# Patient Record
Sex: Male | Born: 1990 | Hispanic: Yes | Marital: Single | Smoking: Never smoker
Health system: Southern US, Community
[De-identification: ages and names within clinical notes are randomized; demographics above are authoritative.]

## PROBLEM LIST (undated history)

## (undated) HISTORY — PX: TONSILLECTOMY: SUR1361

---

## 2015-01-02 ENCOUNTER — Encounter (HOSPITAL_COMMUNITY): Payer: Self-pay | Admitting: *Deleted

## 2015-01-02 ENCOUNTER — Emergency Department (HOSPITAL_COMMUNITY): Payer: Worker's Compensation

## 2015-01-02 ENCOUNTER — Emergency Department (HOSPITAL_COMMUNITY)
Admission: EM | Admit: 2015-01-02 | Discharge: 2015-01-03 | Disposition: A | Discharge status: Home / Self Care | Payer: Worker's Compensation | Attending: Emergency Medicine | Admitting: Emergency Medicine

## 2015-01-02 DIAGNOSIS — W231XXA Caught, crushed, jammed, or pinched between stationary objects, initial encounter: Secondary | ICD-10-CM | POA: Diagnosis not present

## 2015-01-02 DIAGNOSIS — S62639B Displaced fracture of distal phalanx of unspecified finger, initial encounter for open fracture: Secondary | ICD-10-CM

## 2015-01-02 DIAGNOSIS — S61313A Laceration without foreign body of left middle finger with damage to nail, initial encounter: Secondary | ICD-10-CM | POA: Insufficient documentation

## 2015-01-02 DIAGNOSIS — Y929 Unspecified place or not applicable: Secondary | ICD-10-CM | POA: Insufficient documentation

## 2015-01-02 DIAGNOSIS — S61213A Laceration without foreign body of left middle finger without damage to nail, initial encounter: Secondary | ICD-10-CM

## 2015-01-02 DIAGNOSIS — Y998 Other external cause status: Secondary | ICD-10-CM | POA: Insufficient documentation

## 2015-01-02 DIAGNOSIS — S62633B Displaced fracture of distal phalanx of left middle finger, initial encounter for open fracture: Secondary | ICD-10-CM | POA: Insufficient documentation

## 2015-01-02 DIAGNOSIS — Y9389 Activity, other specified: Secondary | ICD-10-CM | POA: Diagnosis not present

## 2015-01-02 DIAGNOSIS — S6992XA Unspecified injury of left wrist, hand and finger(s), initial encounter: Secondary | ICD-10-CM | POA: Diagnosis present

## 2015-01-02 MED ORDER — LIDOCAINE HCL 2 % IJ SOLN
15.0000 mL | Freq: Once | INTRAMUSCULAR | Status: AC
  Administered 2015-01-02: 300 mg
  Filled 2015-01-02: qty 20

## 2015-01-02 MED ORDER — CEPHALEXIN 500 MG PO CAPS: 500.0000 mg | ORAL_CAPSULE | Freq: Four times a day (QID) | ORAL | Status: AC

## 2015-01-02 MED ORDER — CEFAZOLIN SODIUM 1 G IJ SOLR
2.0000 g | Freq: Once | INTRAMUSCULAR | Status: AC
  Administered 2015-01-03: 2 g via INTRAMUSCULAR
  Filled 2015-01-02: qty 20

## 2015-01-02 MED ORDER — OXYCODONE-ACETAMINOPHEN 5-325 MG PO TABS: 1.0000 | ORAL_TABLET | Freq: Four times a day (QID) | ORAL | Status: AC | PRN

## 2015-01-02 MED ORDER — STERILE WATER FOR INJECTION IJ SOLN
INTRAMUSCULAR | Status: AC
  Administered 2015-01-03: 10 mL
  Filled 2015-01-02: qty 10

## 2015-01-02 MED ORDER — TETANUS-DIPHTH-ACELL PERTUSSIS 5-2.5-18.5 LF-MCG/0.5 IM SUSP
0.5000 mL | Freq: Once | INTRAMUSCULAR | Status: AC
  Administered 2015-01-02: 0.5 mL via INTRAMUSCULAR
  Filled 2015-01-02: qty 0.5

## 2015-01-02 NOTE — Discharge Instructions (Signed)
Finger Fracture Fractures of fingers are breaks in the bones of the fingers. There are many types of fractures. There are different ways of treating these fractures. Your health care provider will discuss the best way to treat your fracture. CAUSES Traumatic injury is the main cause of broken fingers. These include:  Injuries while playing sports.  Workplace injuries.  Falls. RISK FACTORS Activities that can increase your risk of finger fractures include:  Sports.  Workplace activities that involve machinery.  A condition called osteoporosis, which can make your bones less dense and cause them to fracture more easily. SIGNS AND SYMPTOMS The main symptoms of a broken finger are pain and swelling within 15 minutes after the injury. Other symptoms include:  Bruising of your finger.  Stiffness of your finger.  Numbness of your finger.  Exposed bones (compound fracture) if the fracture is severe. DIAGNOSIS  The best way to diagnose a broken bone is with X-ray imaging. Additionally, your health care provider will use this X-ray image to evaluate the position of the broken finger bones.  TREATMENT  Finger fractures can be treated with:   Nonreduction--This means the bones are in place. The finger is splinted without changing the positions of the bone pieces. The splint is usually left on for about a week to 10 days. This will depend on your fracture and what your health care provider thinks.  Closed reduction--The bones are put back into position without using surgery. The finger is then splinted.  Open reduction and internal fixation--The fracture site is opened. Then the bone pieces are fixed into place with pins or some type of hardware. This is seldom required. It depends on the severity of the fracture. HOME CARE INSTRUCTIONS   Follow your health care provider's instructions regarding activities, exercises, and physical therapy.  Only take over-the-counter or prescription  medicines for pain, discomfort, or fever as directed by your health care provider. SEEK MEDICAL CARE IF: You have pain or swelling that limits the motion or use of your fingers. SEEK IMMEDIATE MEDICAL CARE IF:  Your finger becomes numb. MAKE SURE YOU:   Understand these instructions.  Will watch your condition.  Will get help right away if you are not doing well or get worse. Document Released: 10/16/2000 Document Revised: 04/24/2013 Document Reviewed: 02/13/2013 [facility] Patient Information 2015 [place], [place]. This information is not intended to replace advice given to you by your health care provider. Make sure you discuss any questions you have with your health care provider.   We recommend that you to take vitamin C 1000 mg a day to promote healing. We also recommend that if you require  pain medicine that you take a stool softener to prevent constipation as most pain medicines will have constipation side effects. We recommend either Peri-Colace or Senokot and recommend that you also consider adding MiraLAX to prevent the constipation affects from pain medicine if you are required to use them. These medicines are over the counter and maybe purchased at a local pharmacy. A cup of yogurt and a probiotic can also be helpful during the recovery process as the medicines can disrupt your intestinal environment. Keep bandage clean and dry.  Call for any problems.  No smoking.  Criteria for driving a car: you should be off your pain medicine for 7-8 hours, able to drive one handed(confident), thinking clearly and feeling able in your judgement to drive. Continue elevation as it will decrease swelling.  If instructed by MD move your fingers within the  confines of the bandage/splint.  Use ice if instructed by your MD. Call immediately for any sudden loss of feeling in your hand/arm or change in functional abilities of the extremity.

## 2015-01-02 NOTE — ED Provider Notes (Signed)
CSN: [id number]     Arrival date & time 01/02/15  2113 History  This chart was scribed for non-physician provider Antony Madura, PA-C, working with Purvis Sheffield, MD by Phillis Haggis, ED Scribe. This patient was seen in room WTR6/WTR6 and patient care was started at 10:27 PM.     Chief Complaint  Patient presents with  . Extremity Laceration   The history is provided by the patient. No language interpreter was used.    HPI Comments: Jared Goodman is a 24 y.o. male who presents to the Emergency Department complaining of left middle finger injury onset 2 hours ago. Pt states that he was lifting an 80 lb metal beam at work and his finger got caught underneath it, hurting his nail and causing a laceration. He reports throbbing pain to the area, currently rates the pain 2/10. He states that he is right hand dominant. He states that he does not know when his last tdap was. He denies taking anything for the pain PTA. He denies numbness or weakness to the area.   History reviewed. No pertinent past medical history. Past Surgical History  Procedure Laterality Date  . Tonsillectomy     No family history on file. History  Substance Use Topics  . Smoking status: Never Smoker   . Smokeless tobacco: Not on file  . Alcohol Use: No    Review of Systems  Skin: Positive for wound.  Neurological: Negative for weakness and numbness.  All other systems reviewed and are negative.   Allergies  Review of patient's allergies indicates no known allergies.  Home Medications   Prior to Admission medications   Medication Sig Start Date End Date Taking? Authorizing Provider  cephALEXin (KEFLEX) 500 MG capsule Take 1 capsule (500 mg total) by mouth 4 (four) times daily. 01/02/15   Antony Madura, PA-C  oxyCODONE-acetaminophen (PERCOCET/ROXICET) 5-325 MG per tablet Take 1-2 tablets by mouth every 6 (six) hours as needed for severe pain. 01/02/15   Antony Madura, PA-C   BP 115/55 mmHg  Pulse 121  Temp(Src) 98 F  (36.7 C) (Oral)  Resp 22  Ht 5\' 10"  (1.778 m)  Wt 175 lb (79.379 kg)  BMI 25.11 kg/m2  SpO2 100%  Physical Exam  Constitutional: He is oriented to person, place, and time. He appears well-developed and well-nourished. No distress.  HENT:  Head: Normocephalic and atraumatic.  Eyes: Conjunctivae and EOM are normal. No scleral icterus.  Neck: Normal range of motion.  Cardiovascular: Intact distal pulses.   Capillary refill brisk in all digits of left hand. Distal radial pulse 2+ left upper extremity  Pulmonary/Chest: Effort normal. No respiratory distress.  Musculoskeletal: Normal range of motion.       Left hand: He exhibits tenderness and laceration. He exhibits normal range of motion. Normal sensation noted. Normal strength noted.       Hands: Normal ROM of the L 3rd digit; normal flexor and extensor strength against resistance.  Neurological: He is alert and oriented to person, place, and time. He exhibits normal muscle tone. Coordination normal.  Sensation to light touch intact along the radial and ulnar aspect of the left third digit.  Skin: Skin is warm and dry. No rash noted. He is not diaphoretic. No erythema. No pallor.  0.5cm laceration to the palmar aspect of the distal end of the L 3rd digit.  Psychiatric: He has a normal mood and affect. His behavior is normal.  Nursing note and vitals reviewed.   ED Course  Procedures (  including critical care time) DIAGNOSTIC STUDIES: Oxygen Saturation is 100% on RA, normal by my interpretation.    COORDINATION OF CARE: 10:29 PM-Discussed treatment plan which includes x-ray and updating tdap with pt at bedside and pt agreed to plan.   Labs Review Labs Reviewed - No data to display  Imaging Review Dg Finger Middle Left  01/02/2015   CLINICAL DATA:  Bruising and swelling of the left third finger after crush injury. Laceration.  EXAM: LEFT MIDDLE FINGER 2+V  COMPARISON:  None.  FINDINGS: Comminuted crush fractures of the distal  phalangeal tuft of the left third finger. Gas is demonstrated under the nail bed suggesting open fracture. Mild soft tissue swelling. No radiopaque soft tissue foreign bodies.  IMPRESSION: Comminuted crush fractures of the distal phalangeal tuft of the left third finger. Gas under the nail bed suggest an open fracture.   Electronically Signed   By: Burman Nieves M.D.   On: 01/02/2015 22:18     EKG Interpretation None           MDM   Final diagnoses:  Open fracture of tuft of distal phalanx of finger, initial encounter  Laceration of third finger of left hand, initial encounter    24 year old male presents to the emergency department for further evaluation of pain to his left third digit after dropping a metal beam on his hand. Patient neurovascularly intact. Subungual hematoma noted with injury to the nailbed. There is also a laceration on the volar aspect of the patient's left third digit. Tetanus updated in the emergency department and patient given 2 g Ancef IM for concern for open fracture seen on x-ray. Open fracture repaired at bedside and dressed by Dr. Amanda Pea. Patient placed on a course of Keflex to cover for infection. He was given a short course of Percocet for pain control. Patient to follow-up with Dr. Amanda Pea in the office this coming week. Return precautions discussed and provided. Patient agreeable to plan with no unaddressed concerns. Patient discharged in good condition.  I personally performed the services described in this documentation, which was scribed in my presence. The recorded information has been reviewed and is accurate.   Filed Vitals:   01/02/15 2126  BP: 115/55  Pulse: 121  Temp: 98 F (36.7 C)  TempSrc: Oral  Resp: 22  Height:  (1.778 m)  Weight: 175 lb (79.379 kg)  SpO2: 100%      Antony Madura, PA-C 01/03/15 0457  Purvis Sheffield, MD 01/03/15 1054

## 2015-01-02 NOTE — Consult Note (Signed)
Reason for Consult: Left middle finger crush injury with open fracture and nail bed injury Referring Physician: ER staff  Jared Goodman is an 24 y.o. male.  HPI: On-the-job injury tonight at [employer] with a crushing injury to the left middle finger. He has an open distal phalanx fracture and nailbed injury. He denies other complaints. He denies neck back chest or abdominal pain. He is alert and oriented in no acute distress. He is been given a tetanus shot and 2 g of Ancef will be given intramuscularly  History reviewed. No pertinent past medical history.  Past Surgical History  Procedure Laterality Date  . Tonsillectomy      No family history on file.  Social History:  reports that he has never smoked. He does not have any smokeless tobacco history on file. He reports that he does not drink alcohol or use illicit drugs.  Allergies: No Known Allergies  Medications: I have reviewed the patient's current medications.  No results found for this or any previous visit (from the past 48 hour(s)).  Dg Finger Middle Left  01/02/2015   CLINICAL DATA:  Bruising and swelling of the left third finger after crush injury. Laceration.  EXAM: LEFT MIDDLE FINGER 2+V  COMPARISON:  None.  FINDINGS: Comminuted crush fractures of the distal phalangeal tuft of the left third finger. Gas is demonstrated under the nail bed suggesting open fracture. Mild soft tissue swelling. No radiopaque soft tissue foreign bodies.  IMPRESSION: Comminuted crush fractures of the distal phalangeal tuft of the left third finger. Gas under the nail bed suggest an open fracture.   Electronically Signed   By: Burman Nieves M.D.   On: 01/02/2015 22:18    Review of Systems  Constitutional: Negative.   Eyes: Negative.   Respiratory: Negative.   Cardiovascular: Negative.   Genitourinary: Negative.   Musculoskeletal: Negative.   Neurological: Negative.   Endo/Heme/Allergies: Negative.    Blood pressure 115/55, pulse 121,  temperature 98 F (36.7 C), temperature source Oral, resp. rate 22, height  (1.778 m), weight 79.379 kg (175 lb), SpO2 100 %. Physical Exam open left middle finger distal phalanx fracture with subungual hematoma and laceration dorsally and volarly about the finger.  No evidence of infection no evidence of vascular compromise.  Patient is alert and oriented.  The patient is alert and oriented in no acute distress. The patient complains of pain in the affected upper extremity.  The patient is noted to have a normal HEENT exam. Lung fields show equal chest expansion and no shortness of breath. Abdomen exam is nontender without distention. Lower extremity examination does not show any fracture dislocation or blood clot symptoms. Pelvis is stable and the neck and back are stable and nontender.  Assessment/Plan: Open left middle finger distal phalanx fracture with nailbed injury and nailbed laceration. He also has a volar laceration.  I consented him for I and D and repair is necessary.   We are planning surgery for your upper extremity. The risk and benefits of surgery to include risk of bleeding, infection, anesthesia,  damage to normal structures and failure of the surgery to accomplish its intended goals of relieving symptoms and restoring function have been discussed in detail. With this in mind we plan to proceed. I have specifically discussed with the patient the pre-and postoperative regime and the dos and don'ts and risk and benefits in great detail. Risk and benefits of surgery also include risk of dystrophy(CRPS), chronic nerve pain, failure of the healing process  to go onto completion and other inherent risks of surgery The relavent the pathophysiology of the disease/injury process, as well as the alternatives for treatment and postoperative course of action has been discussed in great detail with the patient who desires to proceed.  We will do everything in our power to help you  (the patient) restore function to the upper extremity. It is a pleasure to see this patient today.   01/02/2015  11:51 PM  PATIENT:  Jared Goodman    PRE-OPERATIVE DIAGNOSIS:   left middle finger nail bed injury   POST-OPERATIVE DIAGNOSIS:  Same  PROCEDURE:  Irrigation and debridement left middle finger excisional in nature of an open fracture, nailbed repair left middle finger, nail plate removal left middle finger, open treatment distal phalanx fracture left middle finger  SURGEON:  Karen Chafe, MD  PHYSICIAN ASSISTANT: None ANESTHESIA:   Intermetacarpal block  PREOPERATIVE INDICATIONS:  Jared Goodman is a  24 y.o. male with a diagnosis of   The risks benefits and alternatives were discussed with the patient preoperatively including but not limited to the risks of infection, bleeding, nerve injury, cardiopulmonary complications, the need for revision surgery, among others, and the patient was willing to proceed.   OPERATIVE PROCEDURE: Patient was seen sterilely prepped and draped in the sterile fashion after a intermetacarpal block was performed with lidocaine without epinephrine. Patient then had nail plate removed with Camelia Eng. Following this the patient went irrigation debridement and open fracture about the distal phalanx. This was an excisional debridement of a open fracture performed with curette knife blade and scissor. Following this patient underwent setting technique of the distal phalanx with open treatment of distal phalanx fracture. Following this the patient underwent meticulous repair of the nail bed with 4-0 loupe magnification and 6-0 chromic suture for the complex repair. Next the lateral nail fold was repaired. Following this Adaptic was placed under the eponychial fold to prevent nailbed adherent and the patient was dressed sterilely.  Thus the patient underwent irrigation and debridement of an open fracture nail plate removal, nailbed  repair, open treatment of distal phalanx fracture left middle finger.   Keep bandage clean and dry.  Call for any problems.  No smoking.  Criteria for driving a car: you should be off your pain medicine for 7-8 hours, able to drive one handed(confident), thinking clearly and feeling able in your judgement to drive. Continue elevation as it will decrease swelling.  If instructed by MD move your fingers within the confines of the bandage/splint.  Use ice if instructed by your MD. Call immediately for any sudden loss of feeling in your hand/arm or change in functional abilities of the extremity.  We recommend that you to take vitamin C 1000 mg a day to promote healing. We also recommend that if you require  pain medicine that you take a stool softener to prevent constipation as most pain medicines will have constipation side effects. We recommend either Peri-Colace or Senokot and recommend that you also consider adding MiraLAX to prevent the constipation affects from pain medicine if you are required to use them. These medicines are over the counter and maybe purchased at a local pharmacy. A cup of yogurt and a probiotic can also be helpful during the recovery process as the medicines can disrupt your intestinal environment.  Karen Chafe 01/02/2015, 11:49 PM

## 2015-01-02 NOTE — ED Notes (Signed)
Pt states that he was lifting a metal beam and had his hand under it and smashed and cut left middle finger; finger wrapped in triage, bleeding is controlled

## 2015-01-03 DIAGNOSIS — S62633B Displaced fracture of distal phalanx of left middle finger, initial encounter for open fracture: Secondary | ICD-10-CM | POA: Diagnosis not present

## 2016-01-18 IMAGING — CR DG FINGER MIDDLE 2+V*L*
3 series · 3 of 3 positions shown · non-contrast
Comparison: None.

CLINICAL DATA: Bruising and swelling of the left third finger after
crush injury. Laceration.

EXAM:
LEFT MIDDLE FINGER 2+V

[x finger pa left]
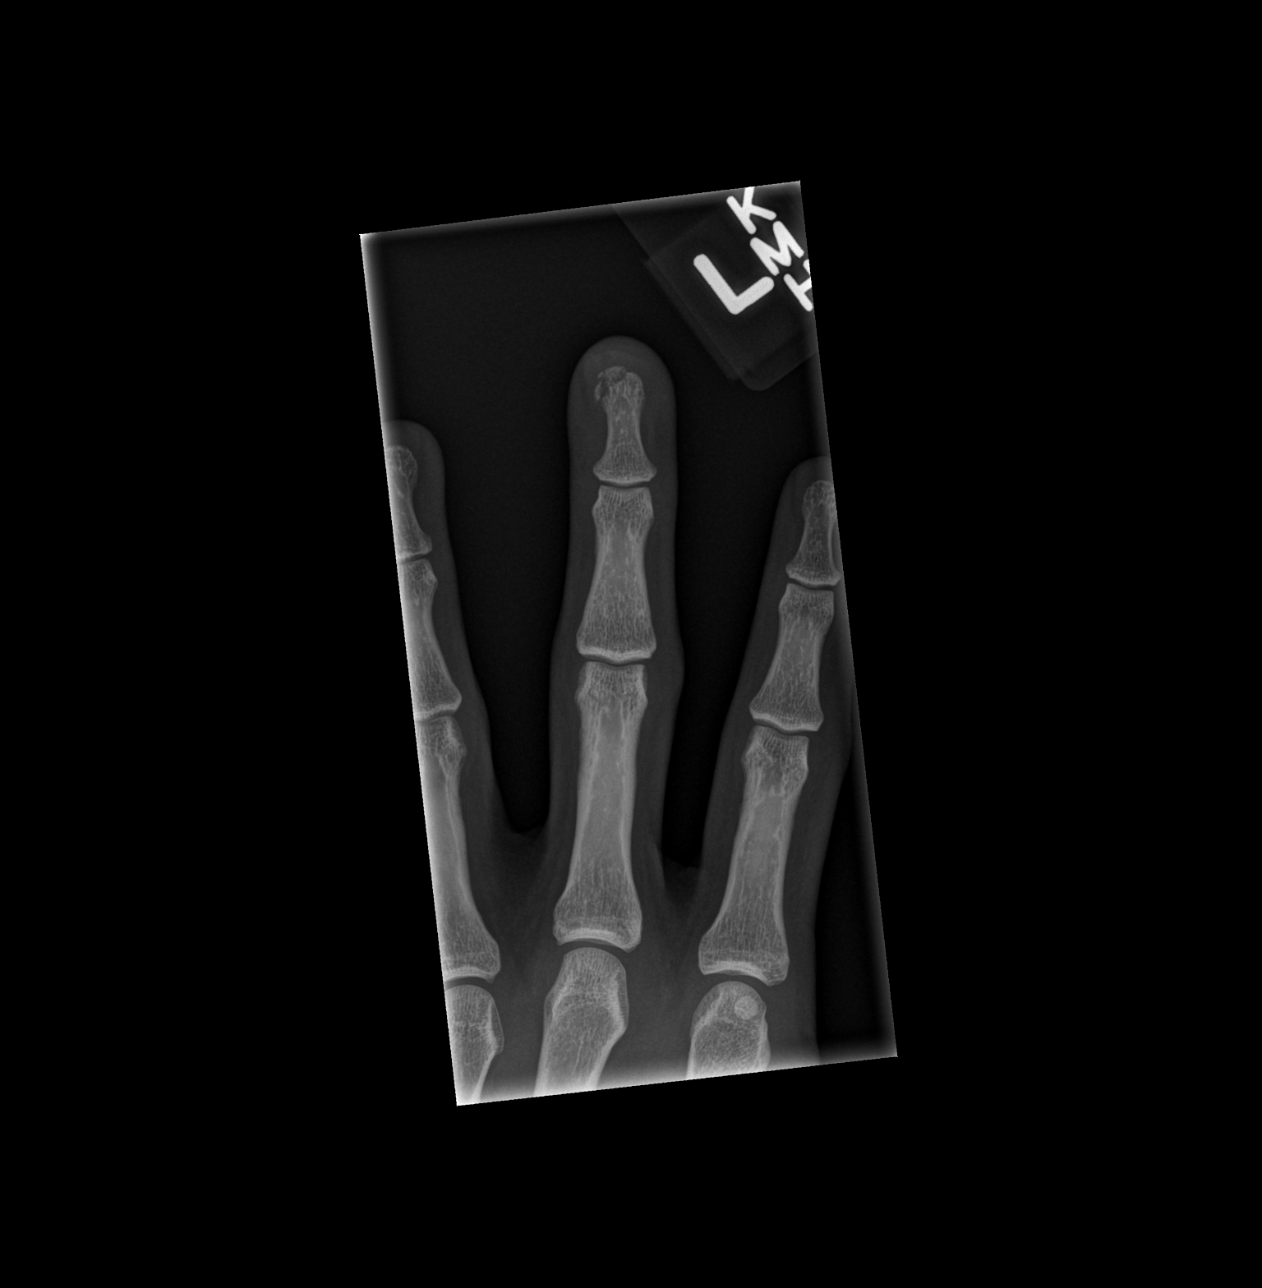

[x finger obl left]
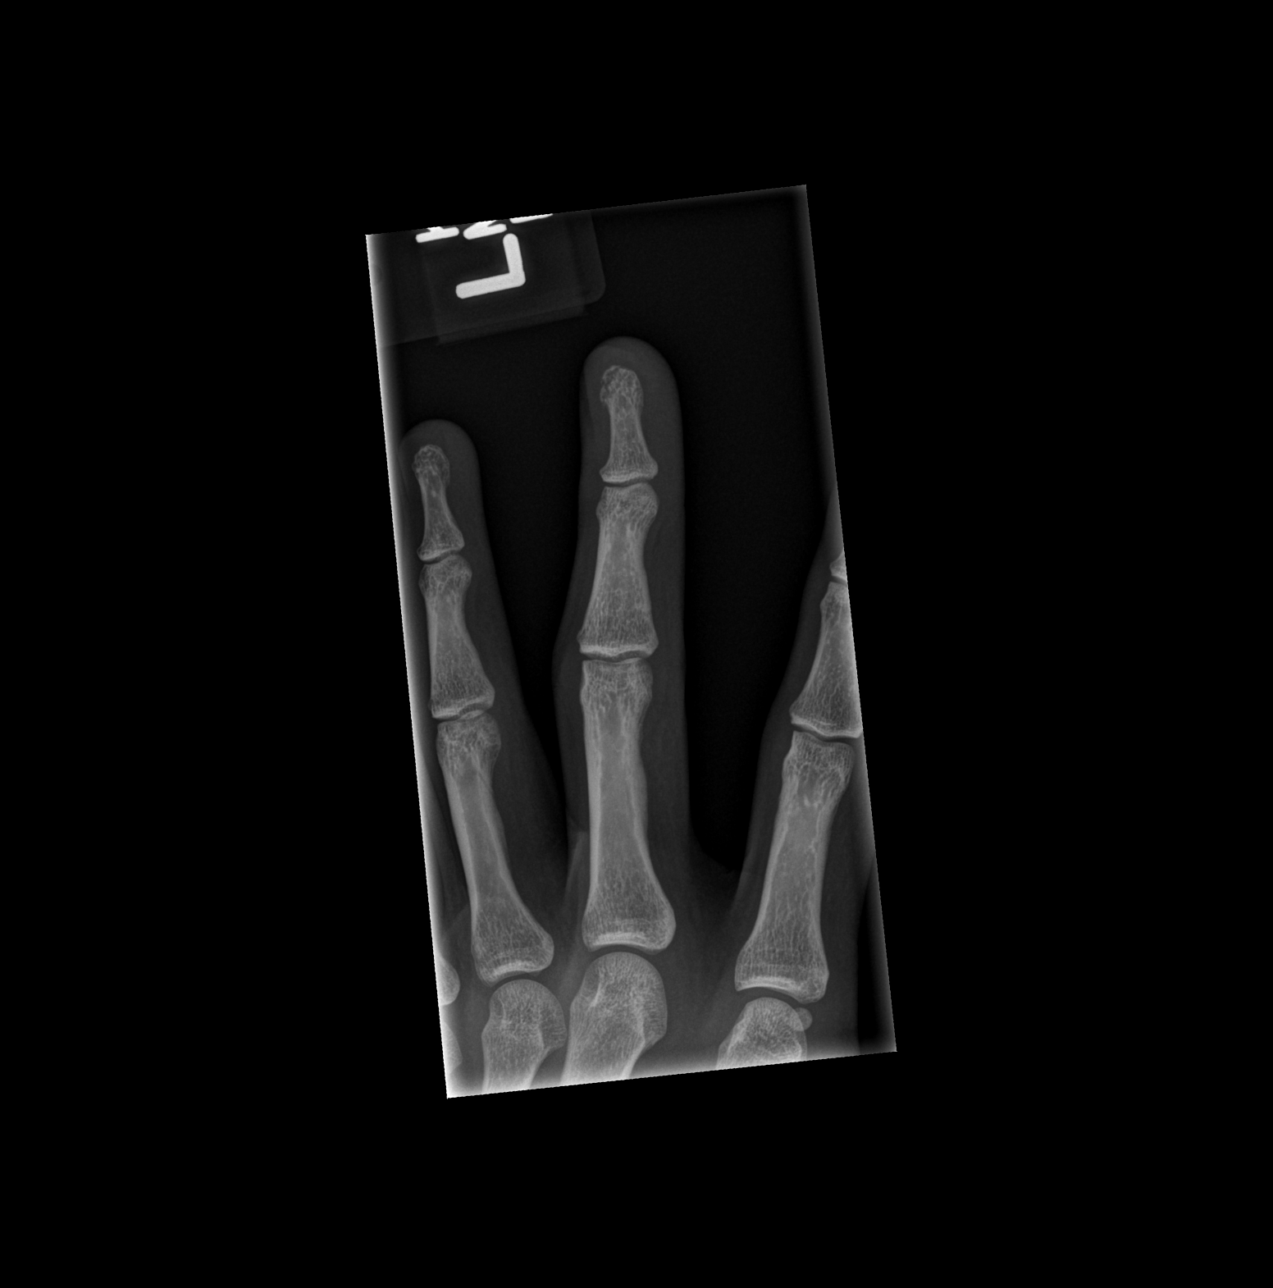

[x finger lat left]
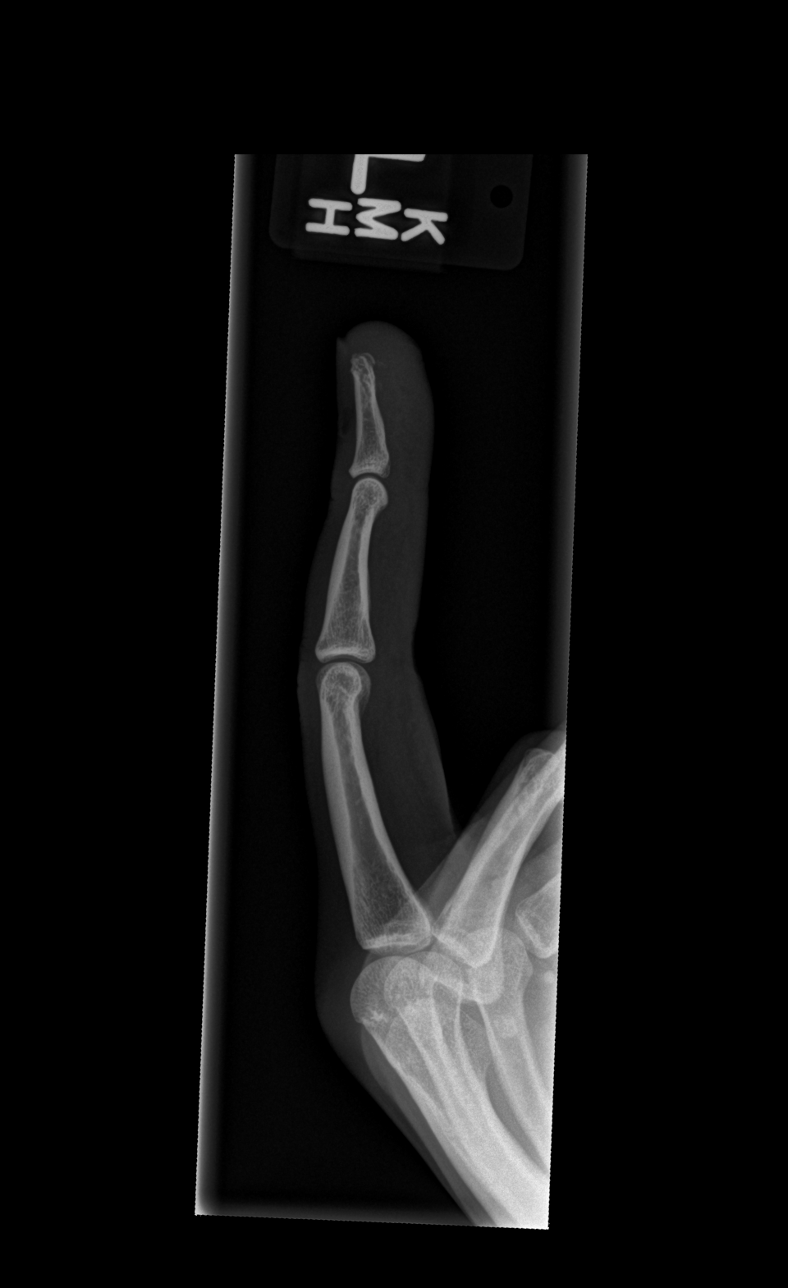

[3 of 3 positions shown; findings below may reference images not displayed]

FINDINGS: Comminuted crush fractures of the distal phalangeal tuft of the left
third finger. Gas is demonstrated under the nail bed suggesting open
fracture. Mild soft tissue swelling. No radiopaque soft tissue
foreign bodies.
IMPRESSION: Comminuted crush fractures of the distal phalangeal tuft of the left
third finger. Gas under the nail bed suggest an open fracture.
